# Patient Record
Sex: Male | Born: 2000 | Race: White | Hispanic: No | Marital: Single
Health system: Southern US, Community
[De-identification: ages and names within clinical notes are randomized; demographics above are authoritative.]

---
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
## 2020-04-22 IMAGING — CR DG HIP (WITH OR WITHOUT PELVIS) 2-3V*R*
2 series · 2 of 2 positions shown · non-contrast
Comparison: None.

CLINICAL DATA: Right hip pain for 6 weeks.

EXAM:
DG HIP (WITH OR WITHOUT PELVIS) 2-3V RIGHT

[w hip ap right]
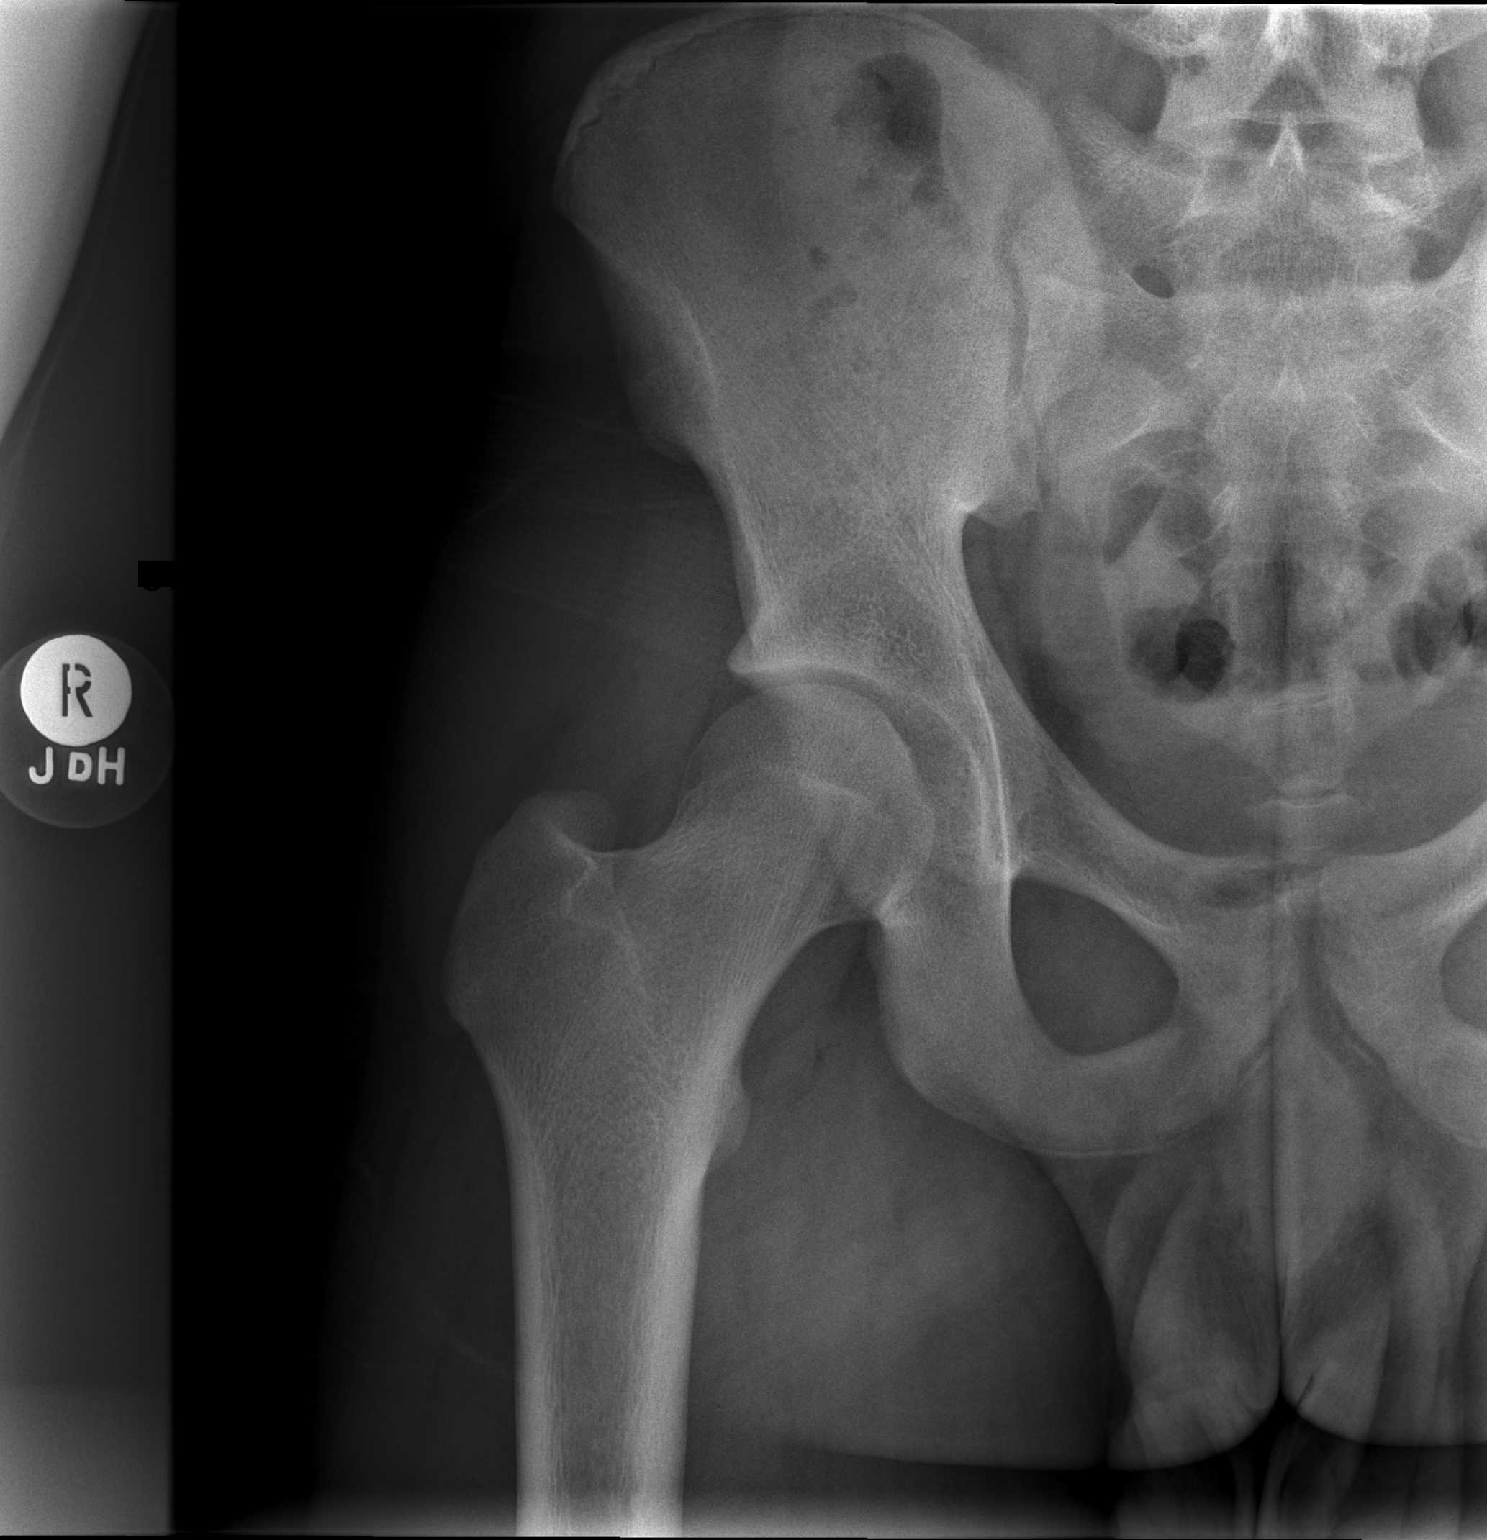

[w hip frog right]
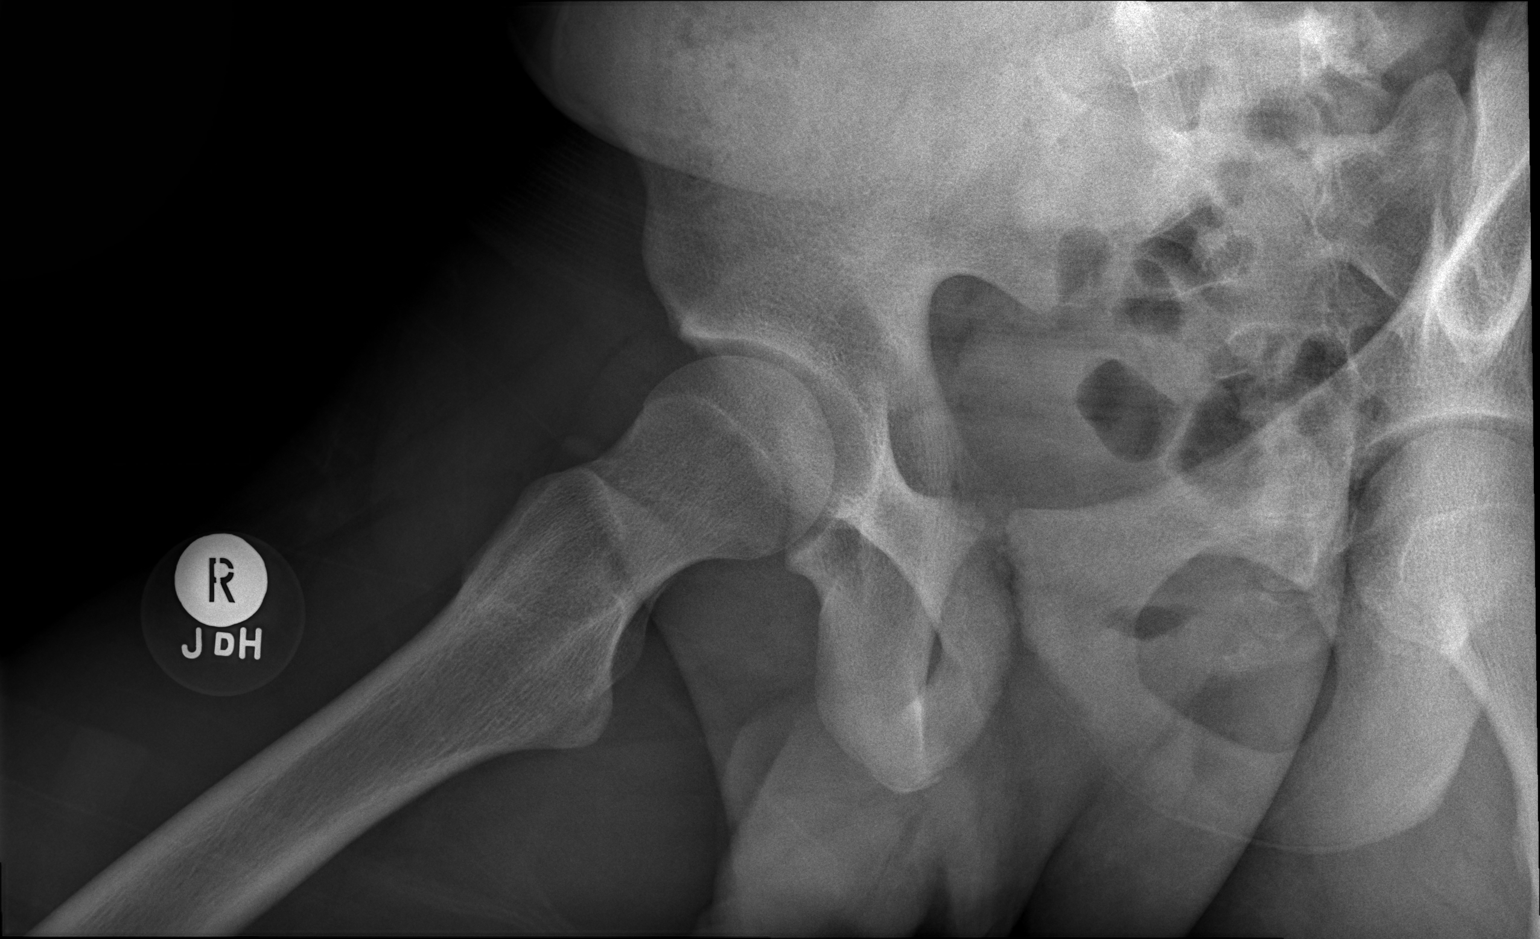

[2 of 2 positions shown; findings below may reference images not displayed]

FINDINGS: No fracture.  No bone lesion.

Hip joint is normally spaced and aligned.  No arthropathic changes.

Soft tissues are unremarkable.
IMPRESSION: Negative.

## 2023-12-27 ENCOUNTER — Encounter (HOSPITAL_BASED_OUTPATIENT_CLINIC_OR_DEPARTMENT_OTHER): Payer: Self-pay | Admitting: Emergency Medicine

## 2023-12-27 ENCOUNTER — Ambulatory Visit (HOSPITAL_BASED_OUTPATIENT_CLINIC_OR_DEPARTMENT_OTHER): Admitting: Radiology

## 2023-12-27 ENCOUNTER — Ambulatory Visit (HOSPITAL_BASED_OUTPATIENT_CLINIC_OR_DEPARTMENT_OTHER): Admission: EM | Admit: 2023-12-27 | Discharge: 2023-12-27 | Disposition: A | Discharge status: Home / Self Care

## 2023-12-27 ENCOUNTER — Ambulatory Visit (HOSPITAL_BASED_OUTPATIENT_CLINIC_OR_DEPARTMENT_OTHER)
Admit: 2023-12-27 | Discharge: 2023-12-27 | Disposition: A | Discharge status: Home / Self Care | Attending: Family Medicine | Admitting: Family Medicine

## 2023-12-27 DIAGNOSIS — S62666A Nondisplaced fracture of distal phalanx of right little finger, initial encounter for closed fracture: Secondary | ICD-10-CM | POA: Diagnosis not present

## 2023-12-27 DIAGNOSIS — M79644 Pain in right finger(s): Secondary | ICD-10-CM | POA: Diagnosis not present

## 2023-12-27 MED ORDER — CELECOXIB 200 MG PO CAPS: 200.0000 mg | ORAL_CAPSULE | Freq: Two times a day (BID) | ORAL | 0 refills | Status: AC | PRN

## 2023-12-27 NOTE — ED Provider Notes (Signed)
 [name]    CSN: [id number] Arrival date & time: 12/27/23  1913      History   Chief Complaint Chief Complaint  Patient presents with   Finger Injury    HPI Ricardo Reynolds is a 23 y.o. male.   Patient reports he was playing ping-pong with his dad last night (12/26/23) and a ball went high and he went up with the paddle to hit it and his hand slammed into a shelf and he heard a pop in his finger.  It is the right little finger or fifth finger.  He cannot bend it.  He can barely move it laterally.  It is swollen and causing pain.     History reviewed. No pertinent past medical history.  There are no active problems to display for this patient.   History reviewed. No pertinent surgical history.     Home Medications    Prior to Admission medications   Medication Sig Start Date End Date Taking? Authorizing Provider  sertraline (ZOLOFT) 50 MG tablet Take 1 tablet by mouth daily. 09/23/23  Yes [provider]  celecoxib (CELEBREX) 200 MG capsule Take 1 capsule (200 mg total) by mouth 2 (two) times daily as needed for up to 15 days (finger pain - take after or with food.). 12/27/23 01/11/24 Yes Prescilla Sours, FNP    Family History History reviewed. No pertinent family history.  Social History Social History   Tobacco Use   Smoking status: Never    Passive exposure: Yes   Smokeless tobacco: Never  Vaping Use   Vaping status: Never Used  Substance Use Topics   Alcohol use: Never   Drug use: Never     Allergies   Ketorolac   Review of Systems Review of Systems  Respiratory:  Negative for cough.   Cardiovascular:  Negative for chest pain.  Gastrointestinal:  Negative for abdominal pain, constipation, diarrhea, nausea and vomiting.  Musculoskeletal:  Positive for joint swelling (right fifth finger). Negative for arthralgias and back pain.  Skin:  Negative for color change and rash.  Neurological:  Negative for syncope.  All other systems  reviewed and are negative.    Physical Exam Triage Vital Signs ED Triage Vitals  Encounter Vitals Group     BP 12/27/23 1946 (!) 142/86     Systolic BP Percentile --      Diastolic BP Percentile --      Pulse Rate 12/27/23 1946 70     Resp 12/27/23 1946 18     Temp 12/27/23 1946 98.4 F (36.9 C)     Temp Source 12/27/23 1946 Oral     SpO2 12/27/23 1946 96 %     Weight --      Height --      Head Circumference --      Peak Flow --      Pain Score 12/27/23 1947 7     Pain Loc --      Pain Education --      Exclude from Growth Chart --    No data found.  Updated Vital Signs BP (!) 142/86 (BP Location: Left Arm)   Pulse 70   Temp 98.4 F (36.9 C) (Oral)   Resp 18   SpO2 96%   Visual Acuity Right Eye Distance:   Left Eye Distance:   Bilateral Distance:    Right Eye Near:   Left Eye Near:    Bilateral Near:     Physical Exam Vitals and  nursing note reviewed.  Constitutional:      General: He is not in acute distress.    Appearance: He is well-developed. He is not ill-appearing or toxic-appearing.  HENT:     Head: Normocephalic and atraumatic.     Right Ear: External ear normal.     Left Ear: External ear normal.     Nose: Nose normal.     Mouth/Throat:     Lips: Pink.     Mouth: Mucous membranes are moist.  Eyes:     Conjunctiva/sclera: Conjunctivae normal.     Pupils: Pupils are equal, round, and reactive to light.  Cardiovascular:     Rate and Rhythm: Normal rate and regular rhythm.     Heart sounds: S1 normal and S2 normal. No murmur heard. Pulmonary:     Effort: Pulmonary effort is normal. No respiratory distress.     Breath sounds: Normal breath sounds. No decreased breath sounds, wheezing, rhonchi or rales.  Musculoskeletal:        General: No swelling.     Right wrist: Normal.     Left wrist: Normal.     Right hand: Swelling (right fifth/little finger) and tenderness (right fifth/little finger) present. Decreased range of motion (right  fifth/little finger - due to pain).     Left hand: Normal.  Skin:    General: Skin is warm and dry.     Capillary Refill: Capillary refill takes less than 2 seconds.     Findings: No rash.  Neurological:     Mental Status: He is alert and oriented to person, place, and time.  Psychiatric:        Mood and Affect: Mood normal.      UC Treatments / Results  Labs (all labs ordered are listed, but only abnormal results are displayed) Labs Reviewed - No data to display  EKG   Radiology DG Finger Little Right Result Date: 12/27/2023 CLINICAL DATA:  Finger pain, injury. EXAM: RIGHT LITTLE FINGER 2+V COMPARISON:  None Available. FINDINGS: Minimally displaced fracture of the fifth digit proximal phalanx. Fracture approaches the proximal interphalangeal joint. Distal and middle phalanx are intact. The joint spaces are normal. Mild soft tissue edema. IMPRESSION: Minimally displaced fracture of the fifth digit proximal phalanx. Electronically Signed   By: Narda Rutherford M.D.   On: 12/27/2023 20:37    Procedures Procedures (including critical care time)  Medications Ordered in UC Medications - No data to display  Initial Impression / Assessment and Plan / UC Course  I have reviewed the triage vital signs and the nursing notes.  Pertinent labs & imaging results that were available during my care of the patient were reviewed by me and considered in my medical decision making (see chart for details).     Correction to patient discharge instructions: X-rays actually show a minimally displaced fracture of the fifth digit proximal phalanx.  I was able to reach the patient and update him.  He will continue to use his finger splint.  He has Celebrex for pain, see below for specific directions.  Follow-up here if symptoms do not improve, worsen or new symptoms occur.  May need to see orthopedics if this is not improving over time. Final Clinical Impressions(s) / UC Diagnoses   Final diagnoses:   Pain in finger of right hand  Closed nondisplaced fracture of distal phalanx of right little finger, initial encounter     Discharge Instructions      X-rays show a nondisplaced fracture of the  distal phalanx of the right little finger.  He has a finger brace on at this time that he brought in with him.  Use Celebrex, 200 mg 1 twice daily take with food for pain.  Follow-up here or with family doctor if symptoms do not improve, worsen or new symptoms occur.  He may need to see orthopedics if he is really not improving.     ED Prescriptions     Medication Sig Dispense Auth. Provider   celecoxib (CELEBREX) 200 MG capsule Take 1 capsule (200 mg total) by mouth 2 (two) times daily as needed for up to 15 days (finger pain - take after or with food.). 30 capsule Prescilla Sours, FNP      PDMP not reviewed this encounter.   Prescilla Sours, FNP 12/27/23 2110

## 2023-12-27 NOTE — Discharge Instructions (Addendum)
 X-rays show a nondisplaced fracture of the distal phalanx of the right little finger.  He has a finger brace on at this time that he brought in with him.  Use Celebrex, 200 mg 1 twice daily take with food for pain.  Follow-up here or with family doctor if symptoms do not improve, worsen or new symptoms occur.  He may need to see orthopedics if he is really not improving.

## 2023-12-27 NOTE — ED Triage Notes (Signed)
 Patient states he injured his right pinky finger yesterday while playing ping pong.
